# Patient Record
Sex: Female | Born: 1935 | Race: White | Hispanic: No | Marital: Married
Health system: Southern US, Community
[De-identification: ages and names within clinical notes are randomized; demographics above are authoritative.]

---

## 2001-12-19 ENCOUNTER — Encounter: Admission: RE | Admit: 2001-12-19 | Discharge: 2001-12-19 | Payer: Self-pay

## 2002-01-17 ENCOUNTER — Inpatient Hospital Stay (HOSPITAL_COMMUNITY): Admission: EM | Admit: 2002-01-17 | Discharge: 2002-01-21 | Payer: Self-pay

## 2002-01-18 ENCOUNTER — Encounter: Payer: Self-pay | Admitting: Family Medicine

## 2002-01-20 ENCOUNTER — Encounter: Payer: Self-pay | Admitting: Cardiovascular Disease

## 2002-01-26 ENCOUNTER — Encounter: Admission: RE | Admit: 2002-01-26 | Discharge: 2002-01-26 | Payer: Self-pay | Admitting: Family Medicine

## 2002-02-17 ENCOUNTER — Ambulatory Visit (HOSPITAL_COMMUNITY): Admission: RE | Admit: 2002-02-17 | Discharge: 2002-02-18 | Payer: Self-pay | Admitting: Internal Medicine

## 2002-02-20 ENCOUNTER — Inpatient Hospital Stay (HOSPITAL_COMMUNITY): Admission: EM | Admit: 2002-02-20 | Discharge: 2002-02-27 | Payer: Self-pay | Admitting: Emergency Medicine

## 2002-02-20 ENCOUNTER — Encounter: Payer: Self-pay | Admitting: Emergency Medicine

## 2002-03-17 ENCOUNTER — Encounter: Payer: Self-pay | Admitting: Internal Medicine

## 2002-03-17 ENCOUNTER — Ambulatory Visit (HOSPITAL_COMMUNITY): Admission: RE | Admit: 2002-03-17 | Discharge: 2002-03-17 | Payer: Self-pay | Admitting: Internal Medicine

## 2002-10-04 ENCOUNTER — Observation Stay (HOSPITAL_COMMUNITY): Admission: EM | Admit: 2002-10-04 | Discharge: 2002-10-05 | Payer: Self-pay | Admitting: Emergency Medicine

## 2002-10-04 ENCOUNTER — Encounter: Payer: Self-pay | Admitting: Emergency Medicine

## 2003-12-15 ENCOUNTER — Other Ambulatory Visit: Admission: RE | Admit: 2003-12-15 | Discharge: 2003-12-15 | Payer: Self-pay | Admitting: Unknown Physician Specialty

## 2005-03-06 ENCOUNTER — Inpatient Hospital Stay (HOSPITAL_COMMUNITY): Admission: EM | Admit: 2005-03-06 | Discharge: 2005-03-07 | Payer: Self-pay | Admitting: *Deleted

## 2005-03-07 ENCOUNTER — Ambulatory Visit: Payer: Self-pay | Admitting: Internal Medicine

## 2005-03-29 ENCOUNTER — Ambulatory Visit: Payer: Self-pay | Admitting: Internal Medicine

## 2005-12-31 ENCOUNTER — Ambulatory Visit (HOSPITAL_COMMUNITY): Admission: RE | Admit: 2005-12-31 | Discharge: 2005-12-31 | Payer: Self-pay | Admitting: Family Medicine

## 2006-02-03 ENCOUNTER — Ambulatory Visit (HOSPITAL_COMMUNITY): Admission: RE | Admit: 2006-02-03 | Discharge: 2006-02-03 | Payer: Self-pay | Admitting: Interventional Radiology

## 2006-02-07 ENCOUNTER — Ambulatory Visit (HOSPITAL_COMMUNITY): Admission: RE | Admit: 2006-02-07 | Discharge: 2006-02-07 | Payer: Self-pay | Admitting: Interventional Radiology

## 2006-04-17 ENCOUNTER — Ambulatory Visit (HOSPITAL_COMMUNITY): Admission: RE | Admit: 2006-04-17 | Discharge: 2006-04-17 | Payer: Self-pay | Admitting: *Deleted

## 2006-04-17 ENCOUNTER — Encounter: Payer: Self-pay | Admitting: Vascular Surgery

## 2006-11-22 ENCOUNTER — Emergency Department (HOSPITAL_COMMUNITY): Admission: EM | Admit: 2006-11-22 | Discharge: 2006-11-22 | Payer: Self-pay | Admitting: Emergency Medicine

## 2007-09-24 ENCOUNTER — Ambulatory Visit (HOSPITAL_COMMUNITY): Admission: RE | Admit: 2007-09-24 | Discharge: 2007-09-24 | Payer: Self-pay | Admitting: Ophthalmology

## 2007-09-27 ENCOUNTER — Inpatient Hospital Stay (HOSPITAL_COMMUNITY): Admission: EM | Admit: 2007-09-27 | Discharge: 2007-10-03 | Payer: Self-pay

## 2007-09-27 ENCOUNTER — Ambulatory Visit: Payer: Self-pay | Admitting: Cardiology

## 2007-09-27 ENCOUNTER — Encounter: Payer: Self-pay | Admitting: Emergency Medicine

## 2007-09-29 ENCOUNTER — Encounter: Payer: Self-pay | Admitting: Cardiology

## 2007-10-01 ENCOUNTER — Ambulatory Visit: Payer: Self-pay | Admitting: Gastroenterology

## 2007-10-02 ENCOUNTER — Encounter (INDEPENDENT_AMBULATORY_CARE_PROVIDER_SITE_OTHER): Payer: Self-pay | Admitting: Emergency Medicine

## 2008-03-25 ENCOUNTER — Ambulatory Visit (HOSPITAL_COMMUNITY): Admission: RE | Admit: 2008-03-25 | Discharge: 2008-03-25 | Payer: Self-pay | Admitting: Ophthalmology

## 2008-04-23 IMAGING — XA DG ERCP WO/W SPHINCTEROTOMY
9 series · 10 of 10 positions shown · non-contrast
Comparison: none

CLINICAL DATA: Acute pancreatitis. Choledocholithiasis.
ERCP:

[Series 1: dr · 1 of 1 slices shown (1 of 5)]
[im 1/1]
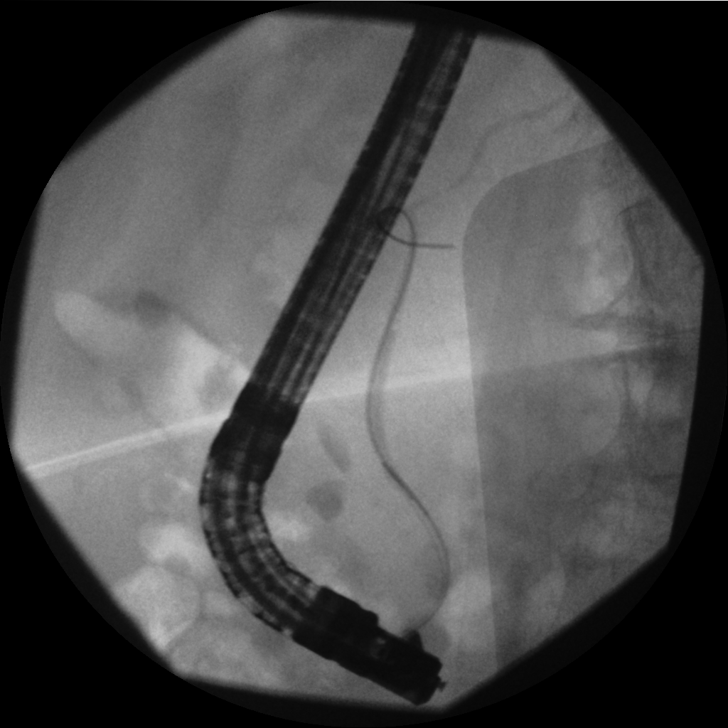

[Series 5: cont. · 1 of 1 slices shown (1 of 4)]
[im 1/1]
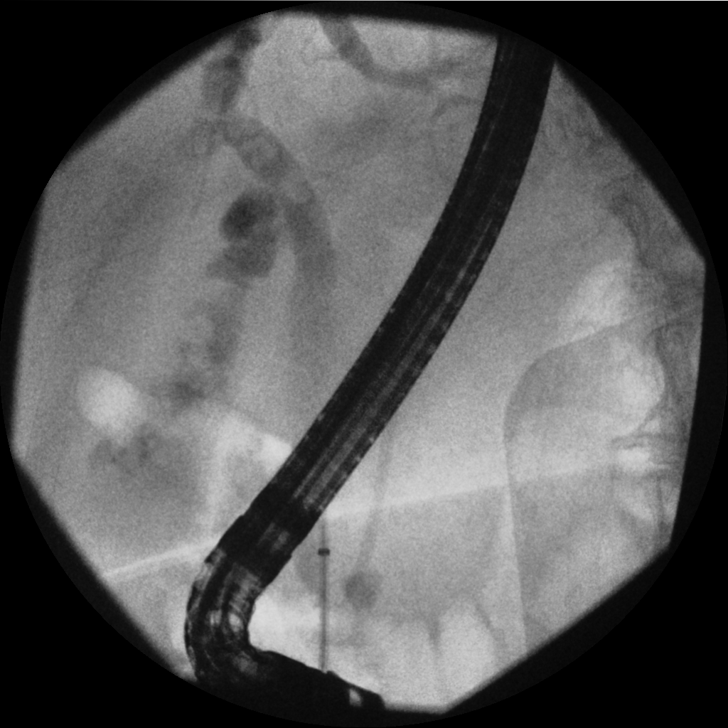

[Series 8: cont. · 1 of 1 slices shown (2 of 4)]
[im 1/1]
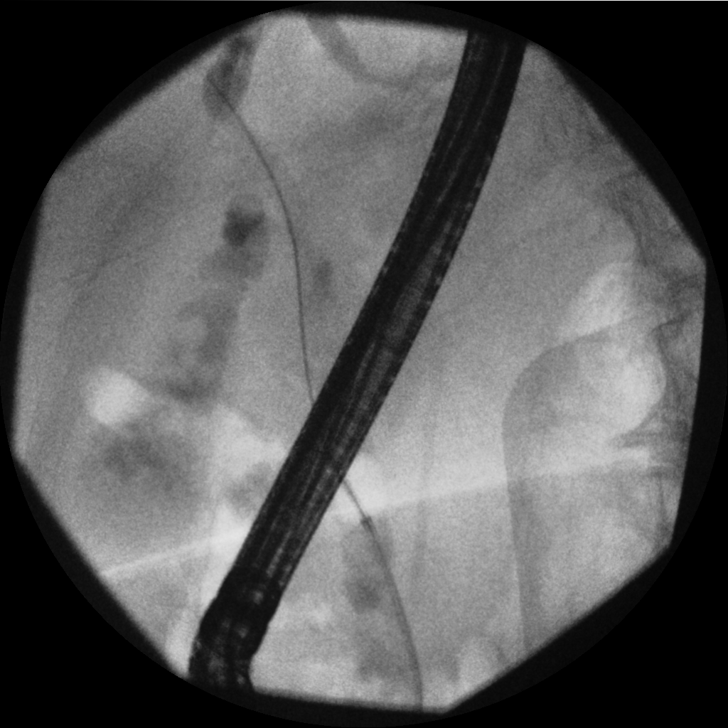

[Series 9: cont. · 1 of 1 slices shown (3 of 4)]
[im 1/1]
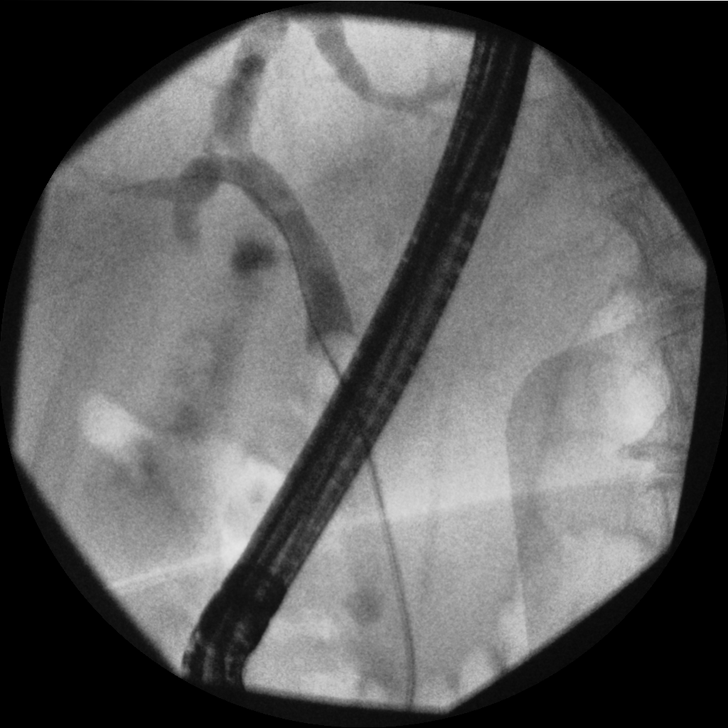

[Series 11: cont. · 2 of 2 slices shown (4 of 4)]
[im 1/2]
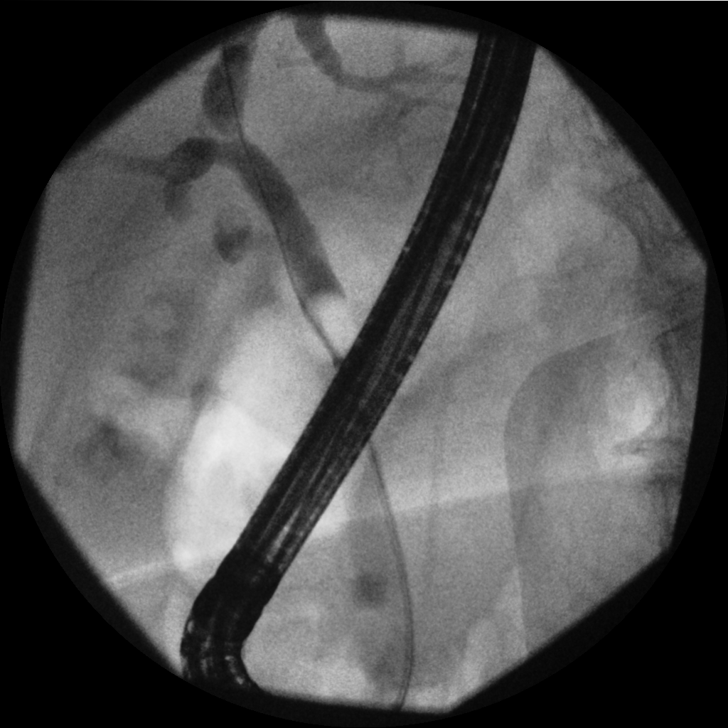
[im 2/2]
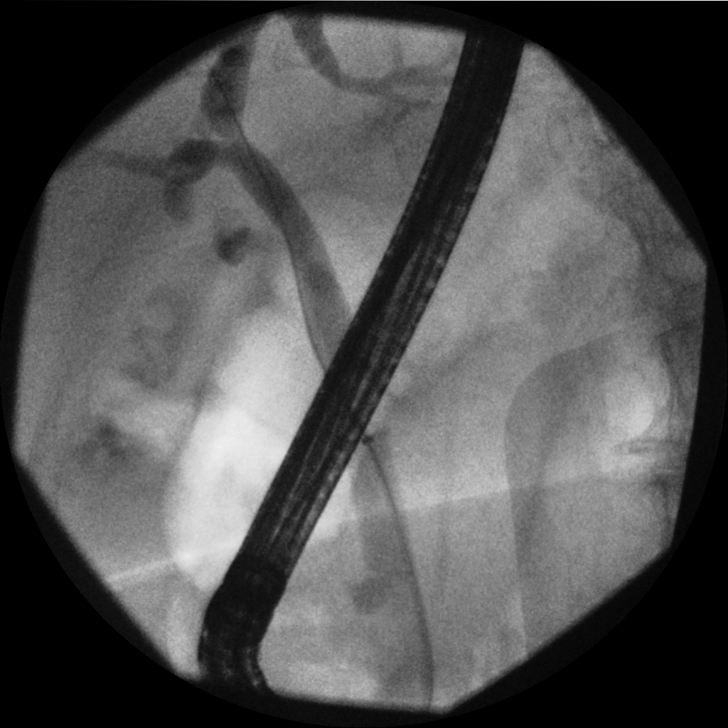

[Series 12: dr · 1 of 1 slices shown (2 of 5)]
[im 1/1]
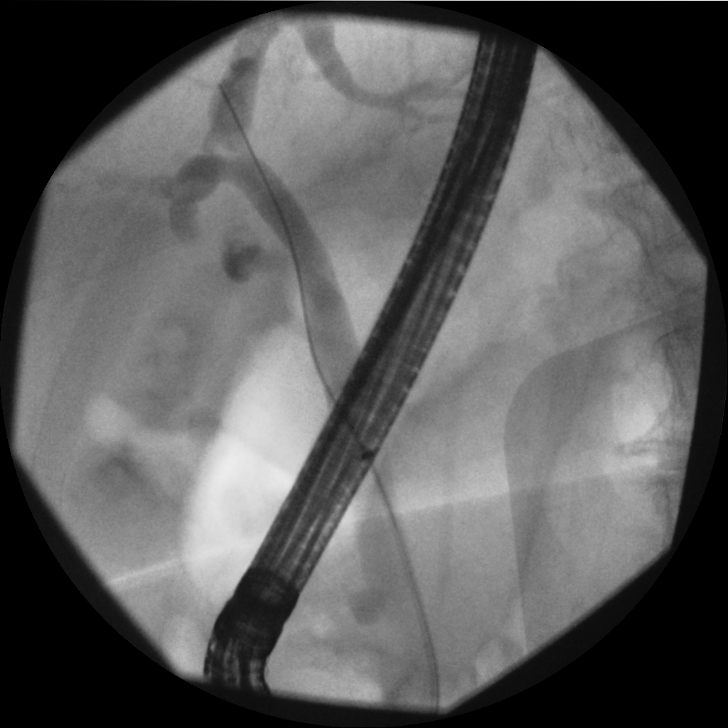

[Series 14: dr · 1 of 1 slices shown (3 of 5)]
[im 1/1]
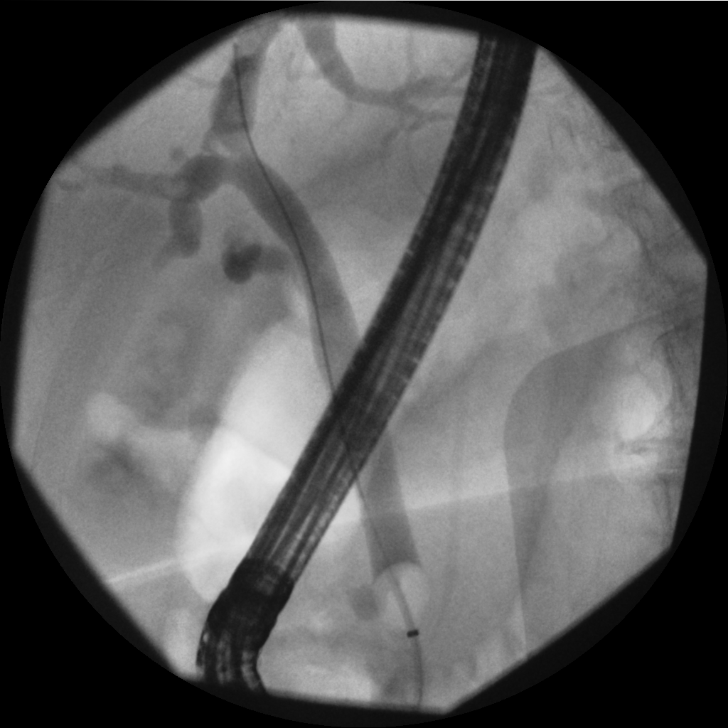

[Series 16: dr · 1 of 1 slices shown (4 of 5)]
[im 1/1]
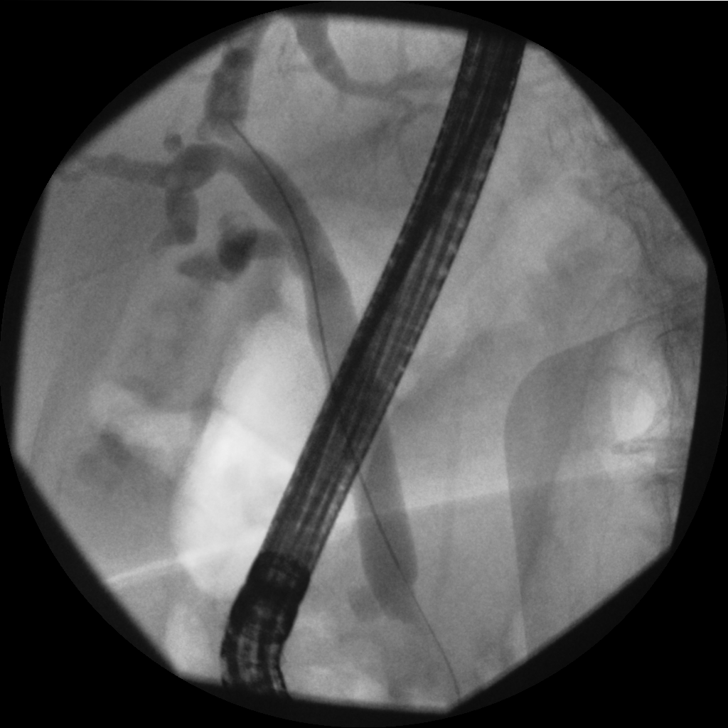

[Series 18: dr · 1 of 1 slices shown (5 of 5)]
[im 1/1]
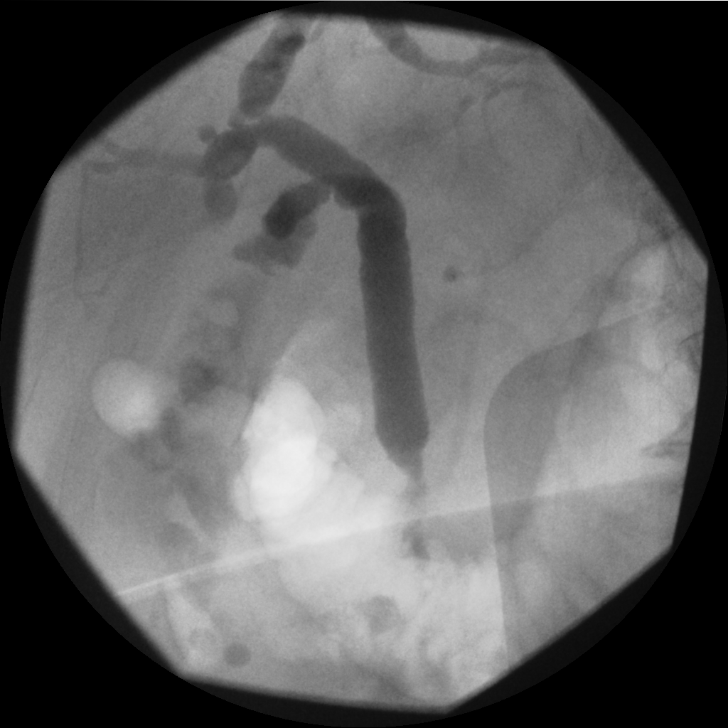

[10 of 10 positions shown; findings below may reference images not displayed]

FINDINGS: Fluoroscopic images were obtained during ERCP.  I was not present for the study. There is opacification of the biliary tree which is mildly dilated.  There is suboptimal opacification of the bile ducts. On one of the images, there are several filling defects in the common hepatic duct which appear to be due to stones. There is early filling of the gallbladder which is not adequately evaluated.
It appears that a sphincterotomy was performed. Balloon inflated cholangiogram was performed with opacification of the bifurcation of the right and left ducts and the common hepatic duct. There may be some residual stones in the common hepatic ducts. There is not adequate opacification for full evaluation. The intrahepatic ducts are mildly dilated.
IMPRESSION: The biliary tree is mildly dilated with several filling defects in the common hepatic duct and in the right and left hepatic ducts compatible with retained stones. There is suboptimal opacification of the biliary tree.

## 2011-04-02 NOTE — Consult Note (Signed)
NAMECERITA, [name]                 ACCOUNT NO.:  [id number]   MEDICAL RECORD NO.:  [id number]          PATIENT TYPE:  OUT   LOCATION:  NUC                          FACILITY:  [facility]   PHYSICIAN:  Arturo Morton. Riley Kill, MD, FACCDATE OF BIRTH:  [birth year]   DATE OF CONSULTATION:  DATE OF DISCHARGE:                                 CONSULTATION   PRIMARY CARDIOLOGIST:  Jake Hochrein.   PRIMARY PHYSICIAN:  Dr. Rudi Heap.   REQUESTING PHYSICIAN:  [facility] team history.   CHIEF COMPLAINT:  Pain in stomach.   HISTORY OF PRESENT ILLNESS:  Ms. Dimitroff is a 77 year old female.  She  presents with discomfort in the epigastrium.  Of note, the pain was in  the epigastrium and radiated to the sides and back.  It did not really  go up into the chest.  She was not diaphoretic or short of breath.  Cardiac enzymes were obtained, and did not reveal myocardial injury.  EKG was done, and reveals T-wave inversion in the anterior precordial  leads and the inferior leads.  Compared to the tracing of 2006, this is  clearly on much more prominent.  Of note, the patient was also noted to  have elevated lipase.  The lipase has come down.  The liver function  studies were abnormal, as well.  The findings were suggestive of  possible choledocholithiasis.  The patient is now symptom free.   PAST MEDICAL HISTORY:  The patient had a stress April 2006 and had a  fixed distal anterolateral defect, thought to be due to breast  attenuation without significant ischemia, and a normal ejection  fraction.  Cardiac catheterization done in 2003, revealed a 25% LAD, and  calcification of the pericardium.  She has had an atrial flutter  ablation in 2003, has a history of tachy-brady syndrome.  She has a  history of rheumatoid arthritis, as well as a multinodular goiter.  She  has gastroesophageal reflux.   ALLERGIES:  THE PATIENT'S ALLERGIES INCLUDE NOVOCAIN.   MEDICATIONS PRIOR TO ADMISSION:  Include Lovenox 40 mg  daily, aspirin 81  mg daily, Protonix 40 mg daily, prednisone 5 mg daily, Reglan,  Phenergan, albuterol, Atrovent, guaifenesin and eye drops.   SOCIAL HISTORY:  The patient lives in [place].  She is married and has  children.  Her husband has had a recent diagnosis of esophageal cancer  and is undergoing treatment for this.  She has been under a lot of  stress.   FAMILY HISTORY:  Is remarkable for mother dying of unknown cause, and  the father died of cancer.  Two brothers had myocardial infarction.   REVIEW OF SYSTEMS:  The patient review of systems:  Denies fever,  chills, sweats or adenopathy.  She has had cataract surgery.  She denies  any rash.  She did have a cough with some bloody-tinged material.  She  has chronic arthralgias.  She denies frequency.  She did have abdominal  pain on Saturday night, as noted.  This is resolved.  Review of systems  is otherwise completely negative.   EXAMINATION:  GENERAL:  On examination she is alert, oriented female who  can provide a cogent history.  VITAL SIGNS:  Temperature is 97.6, pulse 74, respiratory rate 18, blood  pressure 115/58.  the oxygen saturation is 98% on room air.  NECK:  The jugular veins are not distended.  LUNGS:  The lungs are clear to auscultation and percussion.  The PMI is  not displaced.  There is normal first and second heart sound without  definite murmur, rub or gallop.  ABDOMEN:  Soft without tenderness at present.  EXTREMITIES:  Do not reveal edema.  NEUROLOGIC:  Exam is nonfocal.  The extraocular muscles are intact.  The  pupils are equal.   Electrocardiogram demonstrates normal sinus rhythm.  There are fairly  prominent T-wave inversions in V2 through V6, as well as  2, 3 and aVF.  These are markedly increased from 2006.   The chest x-ray does not reveal acute findings.   LABORATORY:  Hemoglobin is 11, hematocrit 32.4, platelet count 172, BUN  is 19, creatinine 1.11 and potassium 4.4.  Liver function  studies are as  noted.  CK was 53, MB 2.0 and troponin 0.02, triglycerides are 151, LDL  cholesterol is 121.   IMPRESSION:  1. Abnormal EKG, more abnormal than 2006.  2. Epigastric pain, most likely secondary to acute pancreatitis of      uncertain etiology.  3. Rheumatoid arthritis.  4. Gastroesophageal reflux.   PLAN:  Normally, at present, I would not recommend further cardiac  evaluation.  She has had some mild EKG abnormalities in the past;  however, she does not have metabolic derangement, and at the present  time she has rather marked EKG changes in the anterolateral and inferior  leads.  While it seems likely that this is not related to ischemia, this  cannot be excluded, and her symptoms were fairly profound.   LABORATORY DATA:  Supports the idea that this most likely represents a  episode of pancreatitis that is gradually resolving.  She is not a  recurrently symptomatic.  I think, given the fact that she will require  cardiac evaluation anyway, my leaning would be in the direction of  putting ERCP off, until we actually have done this.  I think a 2-D echo  would be helpful to tell us whether the T-wave inversions could be in  fact related to left ventricular hypertrophy.  Moreover, myocardial  perfusion imaging study again, which was last done in 2006 would be  worthwhile at this point.  Should she have a marked change in the  pattern, then consideration of further evaluation would be warranted.  I  think at the present time I would be inclined to recommend only  noninvasive studies, since her enzymes are clearly negative.  We can  reassess when these are done.  I have discussed these findings with the  patient and her son in detail.  We will proceed tomorrow, and I have  called Amy Esterwood to defer her ERCP.      Arturo Morton. Riley Kill, MD, [facility]  Electronically Signed     TDS/MEDQ  D:  09/28/2007  T:  09/28/2007  Job:  [id number]

## 2011-04-02 NOTE — H&P (Signed)
NAMEMARISSA, [name]                 ACCOUNT NO.:  [id number]   MEDICAL RECORD NO.:  [id number]          PATIENT TYPE:  INP   LOCATION:  1824                         FACILITY:  [facility]   PHYSICIAN:  Herbie Saxon, MDDATE OF BIRTH:  June 05, 1931   DATE OF ADMISSION:  09/27/2007  DATE OF DISCHARGE:                              HISTORY & PHYSICAL   PRIMARY CARE PHYSICIAN:  Dr. Christell Constant at [facility].   PRESENTING COMPLAINT:  Abdomen pain of 1 day duration.   HISTORY OF PRESENTING COMPLAINT:  This is a 77 year old Caucasian lady  who was quite well until last evening when she started noticing severe  8/10 sharp upper abdominal pain radiating to the chest, associated with  nausea, but no vomiting, no hematemesis, no diarrhea, no constipation.  No abdominal distention.  There was no shortness of breath, diaphoresis,  or dizziness.  She presented to the emergency room at [facility] where she was given nitroglycerin and aspirin.  Presently the  pain has subsided.  Her blood pressure has been noted to be lower than  on presentation.  Presently blood pressure is 90/42, on presentation it  was 125/46.  The patient does not give a history of any such severe  abdominal pain, although she has a history of gastroesophageal reflux  disease.  There are no symptoms referable to the genitourinary,  respiratory, cardiovascular, musculoskeletal, psychiatric systems.  She  has chronic bilateral knee pain because of rheumatoid arthritis.  She  recently had cataract surgery 4 days ago.   PAST SURGICAL HISTORY:  1. Hysterectomy.  2. Fractured pelvis.  3. Status post open reduction internal fixation 2 years ago.   SOCIAL HISTORY:  She lives at home with her husband who is a Optician, dispensing.  They have 5 children.  There is no history of alcohol, tobacco, or  illicit drug abuse.   SHE IS ALLERGIC TO CODEINE AND NOVOCAIN.   MEDICATIONS:  1. Celebrex 200 mg daily.  2. __________  1  every six hours p.r.n.  3. Methotrexate 2.5 mg daily.  4. Multivitamin 1 tablet daily.  5. Nexium 40 mg daily.  6. Prednisone 5 mg daily.  7. Tums p.r.n.   REVIEW OF SYSTEMS:  Twelve systems reviewed.  Pertinent positives as in  the history of presenting complaint.   EXAMINATION:  GENERAL:  She is an elderly lady, not in acute respiratory  distress.  VITAL SIGNS:  Temperature 97.3, pulse 74, respiratory rate is  16, blood pressure 90/42.  Pupils equally round and reactive to light  and accommodation.  Not pale clinically.  No jaundice.  No clubbing, no  cyanosis.  HEAD:  Normocephalic, atraumatic.  Mucous membranes are  moist.  Oropharynx and nasopharynx are clear.  She has reduced bilateral  visual acuity status post cataract surgery.  NECK:  Supple.  No  thyromegaly.  No elevated JVD or carotid bruits.  CHEST:  Clinically  clear.  HEART:  Sounds 1 and 2 regular.  No murmurs.  ABDOMEN:  Soft.  Mild epigastric tenderness.  No organomegaly.  Bowel sounds are  normoactive.  Inguinal orifices are intact.  She is alert and oriented  x3.  Power is 5 in all limbs.  Deep tendon reflexes 2+ globally.  Sensory system and cranial nerves I-XII intact.  Peripheral pulses  present.  No pedal edema.  There is no joint swelling and no skin rash.   Available labs show the sodium is 142, potassium 3.8, chloride 109, BUN  21, creatinine 1.1, glucose 130, bicarbonate 23.  INR is 1.0.  AST is  125, ALT 62, alkaline phosphatase 96.  Lipase is 88.  Bilirubin 1.1.  Chest x-ray showed no acute changes.  EKG showed normal sinus rhythm  with first degree AV block with nonspecific ST and T changes and  inverted T waves and ST depression.  PT 13.2.   ASSESSMENT:  1. Acute bronchitis.  2. Query underlying acute coronary syndrome.  3. First degree heart block.  4. Borderline hypotension.   The patient is to be admitted to a telemetry bed.  She is to be n.p.o.  Will consider GI evaluation after radiologic  studies, and CT of the  abdomen and pelvis is reviewed.  Will monitor cardiac enzymes serially  every 8 hours times 3, with EKG also every 8 hours times 3.  Obtain a  titer function test.  Put on Dilaudid 1 mg IV every 4 to 6 hours p.r.n.  for pain.  Hold her methotrexate for now.  Will continue with her  prednisone 5 mg daily.  Put on Protonix 40 mg IV daily, Phenergan 12.5  IV every 8 hours p.r.n. alternated with Reglan 10 mg IV every 6 hours  p.r.n., Lovenox 40 mg subcutaneous daily for DVT prophylaxis.  O2 2  liters nasal cannula p.r.n.  Will put on IV fluid D5 normal saline at 60  mL an hour.  Monitor lipase and LFTs.  The patient is a FULL CODE.  She  has no health care power of attorney delegated yet.  Her illness,  medication, and tests have been explained to her.  She verbalizes  understanding.   CONDITION:  Fair.   PROGNOSIS:  Guarded.      Herbie Saxon, MD  Electronically Signed     MIO/MEDQ  D:  09/27/2007  T:  09/27/2007  Job:  [id number]   cc:   Ernestina Penna, M.D.

## 2011-04-02 NOTE — Discharge Summary (Signed)
[name], [name]                 ACCOUNT NO.:  [id number]   MEDICAL RECORD NO.:  [id number]          PATIENT TYPE:  INP   LOCATION:  1407                         FACILITY:  [facility]   PHYSICIAN:  Wilson Singer, M.D.DATE OF BIRTH:  [birth year]   DATE OF ADMISSION:  09/27/2007  DATE OF DISCHARGE:  10/03/2007                               DISCHARGE SUMMARY   FINAL DISCHARGE DIAGNOSES:  1. Gallstone pancreatitis status post laparoscopic cholecystectomy on      October 02, 2007.  2. Rheumatoid arthritis.  3. Possible underlying dementia.  4. Macrocytosis, B12, folate, thyroid-stimulating hormone within      normal limits.   MEDICATIONS ON DISCHARGE:  She will continue her home medications which  include  1. Methotrexate 2.5 mg daily.  2. Celebrex 200 mg daily.  3. Prednisone 5 mg daily.  4. Nexium 40 mg daily.  5. Multivitamins 1 tablet daily.  6. Eye drops.  7. Darvocet 1-2 tablets every 4 hours p.r.n. for pain.   CONDITION ON DISCHARGE:  Stable.   HISTORY:  This 77 year old lady came in with abdominal pain.  Please see  initial History and Physical examination done by Dr. Christella Noa.   HOSPITAL PROGRESS:  The patient was admitted, and it was clear from the  outset that she had that acute pancreatitis, although symptoms were not  particularly impressive  Some of the liver enzymes were raised.  She was  seen by gastroenterology who felt that she probably had a pancreatitis  and there was dilation of the common bile duct on CT scanning.  She,  therefore, underwent an ERCP which showed multiple gallstones in the  common bile duct which were removed.  She also underwent review by  cardiology, and echocardiogram showed an ejection fraction of 55-65%,  and there was no obvious ischemia; so arrangements were made for her to  undergo cholecystectomy.   She, therefore, was seen by the surgeons, and she underwent a  laparoscopic cholecystectomy yesterday on October 02, 2007.  Postoperatively, she has done well, and she is tolerating a diet.  She  has very minimal pain in her abdomen.   On physical examination, temperature 97.9, blood pressure 113/46, pulse  78, saturation 98% on room air.  Heart sounds are present and normal.  There are no murmurs.  Lung fields are clear.  Investigations today show  potassium of 4.4, BUN 6, creatinine 1.03. Hemoglobin 11.0, white blood  cell count 8.4, platelets 172.   FURTHER DISPOSITION:  I am discharging her home today, and she is to  follow up with the surgeons in about three weeks' time.  She should  follow up also with her primary care physician in the next week or two.      Wilson Singer, M.D.  Electronically Signed     NCG/MEDQ  D:  10/03/2007  T:  10/04/2007  Job:  [id number]   cc:   Dr. Daphine Deutscher, [facility] Burna Mortimer, MD  [address] [place]   [address]  [place] [address]

## 2011-04-02 NOTE — Consult Note (Signed)
[name], [name]                 ACCOUNT NO.:  [id number]   MEDICAL RECORD NO.:  [id number]          PATIENT TYPE:  INP   LOCATION:  1407                         FACILITY:  [facility]   PHYSICIAN:  Lennie Muckle, MD      DATE OF BIRTH:  [birth year]   DATE OF CONSULTATION:  09/30/2007  DATE OF DISCHARGE:                                 CONSULTATION   REQUESTING PHYSICIAN:  Marlowe Shores.   Ms. Blumenstock is a 77 year old female who was admitted on November 9 due to  epigastric discomfort.  The pain was approximately 8 out of 10, did  radiate to her chest.  She received a cardiac workup with no evidence of  myocardial injury.  She did have a CT scan which revealed stones in the  gallbladder and an enlarged common bile duct.  ERCP was performed today  with multiple stones within the common bile duct as well as in the right  and left hepatic ducts.  Due to the stones within the gallbladder and  the choledocholithiasis, I was consulted to perform cholecystectomy.   ADMISSION CHEMISTRIES:  On November 9th, she had an AST of 125, ALT of  62, alkaline phosphatase was normal at 96.  Her bilirubin was 1.1.  She  did have a slight rise in her bilirubin on November 10 to 1.3.  Today  her bilirubin is normal at 0.8, ALT is 44, which is decreased from  previously as well as a decrease in her AST to normal at 28.   PAST MEDICAL HISTORY:  1. She does have a history of rheumatoid arthritis as well as      multinodular goiter and reflux disease.  2. History of coronary artery disease.  Her last catheterization in      2003 as well as a stress test in 2006, there was no significant      amount of ischemia.  3. She did have atrial flutter and had an ablation in 2003 and has a      history of tachy bradycardia syndrome.   ALLERGY:  NOVOCAIN.   MEDICATIONS AT HOME:  1. Celebrex.  2. Methotrexate.  3. A multivitamin.  4. Nexium 40 mg daily.  5. Prednisone 5 mg.  6. Tums p.r.n.   PAST SURGICAL HISTORY:   She has had:  1. A hysterectomy.  2. A fractured pelvis.  3. Open reduction and internal fixation 2 years ago.   SOCIAL HISTORY:  1. She lives at home with her husband.  2. There is no tobacco or alcohol use.   REVIEW OF SYSTEMS:  Per the patient's chart.  There has been no nausea  or vomiting, no hematemesis or diarrhea, no constipation.  No shortness  of breath, diaphoresis or dizziness.  She does have urinary  incontinence.  She does have bilateral knee pain.   PHYSICAL EXAMINATION:  She is a pleasant elderly female laying in bed in  no acute distress.  VITALS:  Stable.  She is afebrile.  She does have on examination of her skin some mild erythematous changes  on her forehead,  her nose and both cheeks.  ABDOMEN:  Mildly distended but soft.  No significant complaints of  abdominal pain.   ASSESSMENT AND PLAN:  1. Choledocholithiasis.  She is status post endoscopic retrograde      cholangiopancreatography with sphincterotomy.  2. Cholelithiasis.  Due to her recent bout of choledocholithiasis and      need for endoscopic retrograde cholangiopancreatography, will plan      laparoscopic cholecystectomy this admission.  Due to the concern      for a possible post endoscopic retrograde cholangiopancreatography      pancreatitis, I would like to refrain from performing the      cholecystectomy until this Friday to allow some of the inflammation      to resolve.  I discussed with the patient and her son, as well as      her husband, who is present in the room, the laparoscopic      cholecystectomy and I do not think there is a need for an      intraoperative cholangiogram given the fact she has a      sphincterotomy.  Pending her procedure, she will be able to go home      either Friday evening or Saturday morning.      Lennie Muckle, MD     ALA/MEDQ  D:  09/30/2007  T:  10/01/2007  Job:  [id number]

## 2011-04-02 NOTE — Op Note (Signed)
NAMEMARCIANNE, OZBUN                 ACCOUNT NO.:  [id number]   MEDICAL RECORD NO.:  [id number]         PATIENT TYPE:  LINP   LOCATION:                               FACILITY[facility]   PHYSICIAN:  Lennie Muckle, MD      DATE OF BIRTH:  [birth year]   DATE OF PROCEDURE:  10/02/2007  DATE OF DISCHARGE:                               OPERATIVE REPORT   PREOPERATIVE DIAGNOSIS:  Cholelithiasis with history of  choledocholithiasis.   PROCEDURE:  Laparoscopic cholecystectomy.   SURGEON:  Lennie Muckle, M.D.   ASSISTANT:  None.   ANESTHESIA:  General endotracheal.   FINDINGS:  Fatty peritoneum around the gallbladder.  Sent the  gallbladder to pathology.   ESTIMATED BLOOD LOSS:  55 ml.   COMPLICATIONS:  None immediate.   DRAINS:  None.   INDICATIONS FOR PROCEDURE:  Ms. Highland is a 77 year old female who came  to the hospital with abdominal pain, nausea and vomiting.  Was found to  have a gallstone within the common bile duct.  She had an ERCP on  September 30, 2007 with successful retrieval of the gallstone.  I was  asked to perform cholecystectomy.  Informed consent was obtained prior  to the procedure.  The patient was identified in the preoperative  holding suite.  Did received IV cefoxitin preoperatively.  She was then  taken to the operating room, where she was placed in a supine position.  After administration of general endotracheal anesthesia, a time-out  procedure indicating the patient and the procedure were performed.  A  supraumbilical incision was placed with a #11 blade.  Using an Optiview,  a 10 mm trocar was placed under direct visualization with the camera  into the abdominal cavity.  After insufflation towards pneumoperitoneum,  an epigastric port was placed.  Two additional 5 mm ports were placed in  the right side of the patient.  The fundus of the gallbladder was  retracted up towards the head of the patient.  The peritoneum  surrounding the gallbladder  infundibulum was retracted away from the  liver bed.  She had a thick peritoneum surrounding  the infundibulum of  the gallbladder.  I was able to successfully dissect around the cystic  duct and obtain a critical view of the cystic duct, cystic artery, and  the liver bed.  Three clips were placed proximally on the cystic duct  and one distally.  It was transected with laparoscopic scissors.  The  cystic artery was also clipped proximally and distally with a posterior  branch also clipped and transected.  The remaining peritoneal  attachments were dissected with the hook electrocautery.  A specimen was  placed in an Endocatch bag and removed from the  abdomen.  It was passed off the operative field.  The abdomen was  irrigated with 1 liter of saline.  There was no evidence of bleeding at  the end of the case.  The fascial defect was closed with 0 Vicryl  suture.  Patient was then extubated and transported to the post  anesthesia care unit in stable  condition.      Lennie Muckle, MD  Electronically Signed     ALA/MEDQ  D:  10/02/2007  T:  10/03/2007  Job:  [id number]

## 2011-04-05 NOTE — Consult Note (Signed)
[name], [name]                 ACCOUNT NO.:  [id number]   MEDICAL RECORD NO.:  [id number]          PATIENT TYPE:  OUT   LOCATION:  XRAY                         FACILITY:  [facility]   PHYSICIAN:  Sanjeev K. Deveshwar, M.D.DATE OF BIRTH:  06-Apr-1931   DATE OF CONSULTATION:  02/03/2006  DATE OF DISCHARGE:                                   CONSULTATION   CHIEF COMPLAINT:  Sacral insufficiency fracture.   HISTORY OF PRESENT ILLNESS:  This is a pleasant 77 year old female with a  three-month history of back and knee pain. She had an MRI performed at [facility] December 31, 2005 which showed degenerative disk disease with  an insufficiency fracture of the right sacral area. She also had severe  spinal stenosis at L3 and L4. She has had some falls in the past but she  does not remember falling recently around the time that her pain started.  She had been taking hydrocodone 1 tablet every 4-6 hours for pain. The pain  has been quite severe especially with ambulation, although she is fairly  comfortable at rest. She has recently saw Dr. Ethelene Hal who referred the patient  to Dr. Corliss Skains for possible sacroplasty.   PAST MEDICAL HISTORY:  1.  Significant for nonobstructive coronary artery disease.  2.  She had a cardiac catheterization performed in the past that showed a      25% LAD lesion with a normal ejection fraction of 60%.  3.  She has a history of atrial flutter and underwent an ablation performed      by Dr. Lewayne Bunting in 2003.  4.  She has a history of tachy-brady syndrome.  5.  She has a history of bronchitis.  6.  She has a long history of rheumatoid arthritis followed by Dr. Chase Picket as well as fibromyalgia.  7.  She has gastroesophageal reflux disease.  8.  Hip pain felt to be secondary to spinal stenosis.  9.  She has a history of an enlarged thyroid, although her thyroid function      tests have been normal.   SURGICAL HISTORY:  Significant for a nasal  polypectomy.   ALLERGIES:  The patient is intolerant to DARVOCET and CODEINE. She is unable  to tell me the specific reaction. She has had contrast dye for her cardiac  catheterization in the past and tolerated this without problems.   CURRENT MEDICATIONS:  Methotrexate, prednisone 5 milligrams daily, Celebrex,  multivitamins, Nexium, Tums and hydrocodone p.r.n.   SOCIAL HISTORY:  The patient is married. She lives in [place] with her  husband. She has five grown children. She does not use alcohol or tobacco.  She is retired.   FAMILY HISTORY:  Mother died in her 41s from unknown causes. Her father died  in his 50s from cancer.   IMPRESSION/PLAN:  The patient is accompanied by her daughter today. They met  with Dr. Corliss Skains to discuss a possible sacroplasty. Dr. Corliss Skains reviewed  the results of her recent MRI and showed the images to the patient and her  daughter. He pointed out the areas of concern in her sacrum. The fracture  appears to be on the right, although most of the pain she is having is in  the left side of her hip. This is possibly due to her spinal stenosis. Dr.  Corliss Skains recommended the sacroplasty for stabilization of the fracture and  in hopes that this will provide her with some pain relief.   The procedure was discussed in detail with the patient and her daughter  along with the risks and benefits. It was explained that this may not in  fact relieve her pain as she has several possible etiologies for her pain.  The patient and the daughter would like to proceed as soon as possible. She  has been scheduled for Friday, February 07, 2005 at 1:30.   Greater than 40 minutes was spent on this consult.      Delton See, P.A.    ______________________________  Grandville Silos. Corliss Skains, M.D.    DR/MEDQ  D:  02/03/2006  T:  02/04/2006  Job:  [id number]   cc:   Caralyn Guile. Ethelene Hal, M.D.  Fax: [phone]   Madlyn Frankel. Charlann Boxer, M.D.  Fax: [phone]   Ernestina Penna, M.D.   Fax: [phone]   Areatha Keas, M.D.  Fax: [phone]

## 2011-04-05 NOTE — H&P (Signed)
[name], [name]                 ACCOUNT NO.:  [id number]   MEDICAL RECORD NO.:  [id number]          PATIENT TYPE:  EMS   LOCATION:  MAJO                         FACILITY:  [facility]   PHYSICIAN:  Sheppard Penton. Stacie Acres, M.D.  DATE OF BIRTH:  Feb 27, 1931   DATE OF ADMISSION:  DATE OF DISCHARGE:                                HISTORY & PHYSICAL   CARDIOLOGIST:  Doylene Canning. Ladona Ridgel, M.D.   PRIMARY CARE PHYSICIAN:  Dr. Ernestina Penna at [facility]   CHIEF COMPLAINT:  Chest pain.   HISTORY OF PRESENT ILLNESS:  We were asked by the ED staff to see this 77-  year-old Caucasian female who is resting quietly at the present time. Please  note the patient is a very poor historian, very difficult time evaluating  patient for chest pain. Also spoke with son and husband, but evidently this  female had sudden onset of chest discomfort this morning. Initially stated  it was bilateral upper quadrant pain. After breakfast she was sitting at her  table when she had a sudden onset of pain bilateral upper abdomen. She  complains of hard, sharp discomfort and then later stated she had some chest  tightness with this also. She states the discomfort took her breath away.  She stood up and took a Nexium. Initially rated the pain around 10. It  gradually started to ease off; however, it never completely resolved. Her  family insisted she come to the emergency room to get evaluated. She does  state that she has some belching with the episode. However, denied any  nausea, dizziness, or diaphoresis. She later stated she did have some  discomfort down her left shoulder to her left elbow also. Son states the  pain was more in her chest. The patient says she has had another episode  since she arrived at the emergency room with sudden sharp discomfort in her  upper abdomen which is where the patient touches and then states the  discomforted moved up into her sternal area.   ALLERGIES:  CODEINE.   MEDICATIONS:  At home  include methotrexate, but she is unsure of dose. Will  have family clarify. Hydrocodone p.r.n., Celebrex daily, prednisone 5 mg  daily, Nexium p.r.n., Tums t.i.d. with multivitamin daily.   PAST MEDICAL HISTORY:  Cardiac catheterization in April 2003. There is not a  dictation in E-chart from cardiac catheterization, however office notes  states the patient had cardiac catheterization by Dr. Antoine Poche in 2003. The  patient was found to have a normal left main, 25% LAD with luminal  irregularities in the circumflex, and a normal right coronary artery. Her EF  was 60% at that time. Her pericardium was calcified. The patient also had an  echocardiogram in 2003. LV function was found to be normal and there was  trivial mitral regurgitation, there was a patent foramen ovale, and a atrial  septal aneurysm.  The patient also has a history of atrial flutter, status  post ablation that was in March 2003. She also underwent a stress test at  that time showing no evidence of ischemia.  However, patient also with a  history of tachy-brady syndrome. Her other medical history includes  fibromyalgia, arthritis, rheumatoid arthritis, recently diagnosed with a  multinodular goiter.   SOCIAL HISTORY:  She lives in [place] with her husband who is a Optician, dispensing.  They have five adult children. She denies any tobacco use. She states she  stopped smoking at age 19 and only smoked for a few years. Her exercise is  somewhat limited secondary to her arthritis. She denies any ETOH __________  of her medication use. She has no diet restrictions.   FAMILY HISTORY:  Her mother deceased in her late 43s of unknown cause.  Father deceased in his 30s secondary to cancer, unknown source. She has  siblings alive and well. She has a brother who has had at least two heart  attacks.   REVIEW OF SYSTEMS:  Negative for fever, chills, sweats, or weight changes.  HEENT: Positive for headache at the present time.  Denies any  nasal  discharge, voice changes, vision, hearing loss, or dental problems. SKIN:  Complains of circular lesions to left shin with mild weeping at times.  CARDIOPULMONARY: Positive for chest pain, shortness of breath, dyspnea on  exertion, edema per patient in left shin. GU: Positive for frequency,  urgency, nocturia four to five times a night. MS: Positive for arthralgia,  joint swelling, pain in knees, hands, and toes secondary to RA. GI: Negative  for nausea, vomiting, or diarrhea. Denies any melena. Denies any  hematemesis, GERD. Complains of abdominal pain today.   PHYSICAL EXAMINATION:  VITAL SIGNS: She is afebrile, pulse 82 and regular,  respirations 20, blood pressure 134/61. She is saturating 99% on room air.  GENERAL: Currently she is lying on the stretcher in no acute distress, alert  and oriented, obese, appears stated age.  HEENT: Pupils equal, round, and reactive to light. Her sclera is clear.  NECK: Supple without lymphadenopathy. Negative bruit or JVD.  CARDIOVASCULAR: Heart rate regular rhythm, S1 and S2. She has a 2/6 systolic  ejection murmur. Pulses are 2+ and equal.  LUNGS: Clear to auscultation with decreased breath sounds in bilateral  bases.  SKIN: She has two circular red lesions to her left shin.  ABDOMEN: Soft, nontender, obese. Unable to palpate liver margins. Positive  bowel sounds.  EXTREMITIES: No clubbing, cyanosis, or edema. She does have a hammer toe on  each foot.  MUSCULOSKELETAL: Positive for joint deformity in fingers and toes. No spinal  or CVA tenderness.  NEUROLOGIC: She is alert and oriented times three with cranial nerves II-XII  grossly intact.   Chest x-ray showing cardiomegaly with mild changes in CHF with overlying  COPD. EKG reveals rate of 81, sinus rhythm. She does have generalized T-wave  inversion, however this has not changed. According to the office note there  is no old EKG is compare.  Lab work showing a white blood cell count  of 8.5, hemoglobin 12.9,  hematocrit 38, platelet count 212,000. Sodium 142, potassium 4.1, BUN 19,  creatinine 1.0, glucose 98.  Liver enzymes are within normal limits. Lipase  42. Point of care enzymes reveal troponin less than 0.05 time three,  myoglobulin 104.   Dr. Valera Castle into assess and examine the patient. The patient's chest  pain/abdominal discomfort, difficulty to get exact information from the  patient, however with her risk factors and symptoms we will admit her, cycle  cardiac enzymes, decrease breath sounds, and we will also check a BNP level,  and get  a PA and lateral chest x-ray. If her cardiac enzymes are negative we  will plan on doing an adenosine Myoview in the morning. Will definitely  continue with a PPI daily. Emergency room physician has already started  nitroglycerin and heparin. We will continue this. Pharmacy to regulate  heparin. Questionable musculoskeletal pain. Patient with known history of  rheumatoid arthritis. Prednisone, methotrexate, and Celebrex.  Continue  aspirin daily also.  Will also check a hemoglobin A1C  in the morning. So the plan is to admit, rule out myocardial infarction. If  negative enzymes, we will do an adenosine stress test. If positive, the  patient will need further cardiac catheterization. We will continue a proton  pump inhibitor. We will need to continue her medications from home for her  rheumatoid arthritis.      MB/MEDQ  D:  03/06/2005  T:  03/06/2005  Job:  [id number]

## 2011-04-05 NOTE — Discharge Summary (Signed)
[name], Susan                 ACCOUNT NO.:  [id number]   MEDICAL RECORD NO.:  [id number]          PATIENT TYPE:  INP   LOCATION:  3733                         FACILITY:  [facility]   PHYSICIAN:  Pricilla Riffle, M.D.    DATE OF BIRTH:  08/26/1931   DATE OF ADMISSION:  03/06/2005  DATE OF DISCHARGE:  03/07/2005                                 DISCHARGE SUMMARY   PROCEDURES:  Adenosine Cardiolite.   DISCHARGE DIAGNOSES:  1.  Chest pain, cardiac enzymes negative for myocardial infarction and      Cardiolite negative for ischemia.  2.  History of atrial flutter, status post ablation in 2003.  3.  History of nonobstructive coronary artery disease, with:      1.  An left anterior descending 25% stenosis.      2.  Normal left main.      3.  Luminal irregularities in the circumflex.      4.  Codominant right coronary normal,      5.  Ejection fraction  60%.  4.  Family history of coronary artery disease, with a sister who had MI at      the age of 70.  5.  Intolerant of DARVOCET.  6.  Gastroesophageal reflux disease.  7.  Rheumatoid arthritis.  8.  Fibromyalgia.  9.  Chronic bilateral hip pain secondary to spinal stenosis.  10. History of enlarged thyroid, with TFTs within normal limits.  11. History of near syncope in 2003; negative MI, negative CT and normal      TSH.  12. History of tachy-brady syndrome, heart rate in the 30's (on Cardizem).  13. Status post nasal polypectomy.   HOSPITAL COURSE:  Ms. Spark is a 77 year old female, with a history of  nonsignificant coronary artery disease by catheterization in 2003, who had  chest pain on the day of admission and came to the hospital, where she was  admitted for further evaluation and treatment.   Her symptoms were concerning for reflux symptoms and her proton pump  inhibitor was increased from p.r.n. to b.i.d.. Her cardiac enzymes were  negative for MI and a Cardiolite was performed. The Cardiolite results  showed no ischemia,  no wall motion abnormalities and a possible defect that  was more likely attenuation, but possibly scar in the distal anterolateral  wall. EF 73%. Dr. Tenny Craw reviewed the results and felt that no further  inpatient workup was needed. There was concern for rheumatoid arthritis  causing symptoms and a sedimentation rate has been ordered and is pending at  the time of dictation. Dr. Tenny Craw felt that Ms. Marlar could be discharged  home on current medical therapy and follow up as an outpatient. Ms. Yerian  was considered stable for discharge on March 07, 2005.   DISCHARGE INSTRUCTIONS:  1.  Activity level is to be as tolerated.  2.  She is to stick to a low-fat diet.  3.  She is to follow up with Dr. Ladona Ridgel next week and with Dr. Christell Constant at      [facility] as needed or  as scheduled.   DISCHARGE MEDICATIONS:  1.  Methotrexate 7 tablets every week.  2.  Hydrocodone p.r.n.  3.  Celebrex 200 mg q.d.  4.  Prednisone 5 mg  q.d.  5.  Nexium b.i.d.  6.  Tums and multivitamins as prior to admission.  7.  Nasonex as prior to admission.      RB/MEDQ  D:  03/07/2005  T:  03/08/2005  Job:  [id number]   cc:   Ernestina Penna, M.D.  [address] [place]  [place] [address]  Fax: [phone]   Doylene Canning. Ladona Ridgel, M.D.

## 2011-06-19 DEATH — deceased

## 2011-08-27 LAB — HEPATIC FUNCTION PANEL
ALT: 62 — ABNORMAL HIGH
ALT: 84 — ABNORMAL HIGH
AST: 125 — ABNORMAL HIGH
AST: 85 — ABNORMAL HIGH
Bilirubin, Direct: 0.4 — ABNORMAL HIGH
Indirect Bilirubin: 0.6
Indirect Bilirubin: 0.9
Total Bilirubin: 1.1
Total Bilirubin: 1.3 — ABNORMAL HIGH
Total Protein: 6.4

## 2011-08-27 LAB — CBC
HCT: 32 — ABNORMAL LOW
HCT: 32.4 — ABNORMAL LOW
MCHC: 34.1
MCV: 103.5 — ABNORMAL HIGH
Platelets: 172
Platelets: 150
RBC: 3.15 — ABNORMAL LOW
RDW: 14.9
WBC: 4.9
WBC: 7

## 2011-08-27 LAB — URINALYSIS, ROUTINE W REFLEX MICROSCOPIC
Bilirubin Urine: NEGATIVE
Hgb urine dipstick: NEGATIVE
Ketones, ur: NEGATIVE
Protein, ur: NEGATIVE
Urobilinogen, UA: 1

## 2011-08-27 LAB — DIFFERENTIAL
Basophils Absolute: 0
Basophils Relative: 0
Eosinophils Absolute: 0.2
Eosinophils Relative: 4
Lymphocytes Relative: 29
Neutro Abs: 3.1
Neutro Abs: 2.7
Neutrophils Relative %: 57
Neutrophils Relative %: 56

## 2011-08-27 LAB — I-STAT 8, (EC8 V) (CONVERTED LAB)
Acid-base deficit: 1
Bicarbonate: 23.6
HCT: 37
Operator id: 133351
pCO2, Ven: 38.5 — ABNORMAL LOW
pH, Ven: 7.395 — ABNORMAL HIGH

## 2011-08-27 LAB — COMPREHENSIVE METABOLIC PANEL
ALT: 29
ALT: 37 — ABNORMAL HIGH
AST: 21
Albumin: 3.1 — ABNORMAL LOW
Alkaline Phosphatase: 77
BUN: 6
BUN: 6
CO2: 21
CO2: 27
Calcium: 8.6
Calcium: 8.9
Chloride: 107
Chloride: 114 — ABNORMAL HIGH
Chloride: 114 — ABNORMAL HIGH
Creatinine, Ser: 0.88
Creatinine, Ser: 1
GFR calc Af Amer: >60
GFR calc Af Amer: >60
GFR calc non Af Amer: 54 — ABNORMAL LOW
GFR calc non Af Amer: >60
Glucose, Bld: 114 — ABNORMAL HIGH
Glucose, Bld: 121 — ABNORMAL HIGH
Sodium: 139
Sodium: 138
Sodium: 143
Total Bilirubin: 0.8
Total Bilirubin: 0.8
Total Protein: 5.5 — ABNORMAL LOW
Total Protein: 5.6 — ABNORMAL LOW

## 2011-08-27 LAB — CK TOTAL AND CKMB (NOT AT ARMC)
CK, MB: 2
Relative Index: INVALID
Total CK: 53

## 2011-08-27 LAB — POCT I-STAT CREATININE
Creatinine, Ser: 1.1
Operator id: 133351

## 2011-08-27 LAB — POCT CARDIAC MARKERS
CKMB, poc: 1.3
Operator id: 133351
Operator id: 133351
Troponin i, poc: <0.05

## 2011-08-27 LAB — PROTIME-INR: INR: 1

## 2011-08-27 LAB — MAGNESIUM: Magnesium: 2.3

## 2011-08-27 LAB — BASIC METABOLIC PANEL
CO2: 24
Calcium: 9.2
Creatinine, Ser: 1.11
Glucose, Bld: 114 — ABNORMAL HIGH

## 2011-08-27 LAB — FOLATE: Folate: 6.7

## 2011-08-27 LAB — VITAMIN B12: Vitamin B-12: 528 (ref 211–911)

## 2011-08-27 LAB — TSH: TSH: 0.707

## 2011-08-27 LAB — LIPID PANEL
Cholesterol: 190
LDL Cholesterol: 121 — ABNORMAL HIGH
Total CHOL/HDL Ratio: 4.9
Triglycerides: 151 — ABNORMAL HIGH
VLDL: 30

## 2011-08-27 LAB — CALCIUM: Calcium: 9.2

## 2011-08-27 LAB — TROPONIN I: Troponin I: 0.02

## 2014-11-17 ENCOUNTER — Telehealth: Payer: Self-pay | Admitting: *Deleted

## 2014-11-17 NOTE — Telephone Encounter (Signed)
error
